# Patient Record
Sex: Male | Born: 1965 | Race: White | Hispanic: No | Marital: Married | Smoking: Current every day smoker
Health system: Southern US, Community
[De-identification: ages and names within clinical notes are randomized; demographics above are authoritative.]

---

## 2016-11-09 ENCOUNTER — Emergency Department: Payer: Medicaid Other

## 2016-11-09 ENCOUNTER — Encounter: Payer: Self-pay | Admitting: *Deleted

## 2016-11-09 ENCOUNTER — Emergency Department
Admission: EM | Admit: 2016-11-09 | Discharge: 2016-11-09 | Disposition: A | Discharge status: Home / Self Care | Payer: Medicaid Other | Attending: Emergency Medicine | Admitting: Emergency Medicine

## 2016-11-09 DIAGNOSIS — M79675 Pain in left toe(s): Secondary | ICD-10-CM

## 2016-11-09 DIAGNOSIS — R030 Elevated blood-pressure reading, without diagnosis of hypertension: Secondary | ICD-10-CM | POA: Diagnosis not present

## 2016-11-09 MED ORDER — NAPROXEN 500 MG PO TABS: 500.0000 mg | ORAL_TABLET | Freq: Two times a day (BID) | ORAL | 0 refills | Status: AC

## 2016-11-09 NOTE — Discharge Instructions (Signed)
Follow-up with the name of the physician listed on your Medicaid card. Your blood pressure needs to be rechecked. Discontinue taking ibuprofen and begin taking naproxen 500 mg twice a day with food. Wear postop shoe for added support and to decrease bending over today.

## 2016-11-09 NOTE — ED Notes (Signed)
See triage note.  States he stubbed his left great toe several months ago  conts to have pain   States pain is at base of toe and moving into lateral foot   No deformity noted

## 2016-11-09 NOTE — ED Provider Notes (Signed)
[facility] Emergency Department Provider Note ____________________________________________  Time seen: 10:11 AM  I have reviewed the triage vital signs and the nursing notes.  HISTORY  Chief Complaint  Toe Pain   HPI Bradley Michael is a 51 y.o. male is here with complaint of left great toe hurting. Patient states that he injured his toe 6 months ago and was not seen at that time for his injury. He states his continued to hurt but today especially with ambulation. It is not taking any over-the-counter medication for his pain. He denies any history of gout. Currently rates his pain as a 5/10. He has occasionally taken ibuprofen with minimal relief.  History reviewed. No pertinent past medical history.  There are no active problems to display for this patient.   No past surgical history on file.  Prior to Admission medications   Medication Sig Start Date End Date Taking? Authorizing Provider  naproxen (NAPROSYN) 500 MG tablet Take 1 tablet (500 mg total) by mouth 2 (two) times daily with a meal. 11/09/16   Tommi Rumps, PA-C    Allergies Penicillins  History reviewed. No pertinent family history.  Social History Social History  Substance Use Topics  . Smoking status: Not on file  . Smokeless tobacco: Not on file  . Alcohol use Not on file    Review of Systems  Constitutional: Negative for fever. Cardiovascular: Negative for chest pain. Respiratory: Negative for shortness of breath. Musculoskeletal:Positive left great toe pain chronic. Skin: Negative for rash. Neurological: Negative for  focal weakness or numbness. ____________________________________________  PHYSICAL EXAM:  VITAL SIGNS: ED Triage Vitals  Enc Vitals Group     BP 11/09/16 0925 (!) 162/108     Pulse Rate 11/09/16 0925 (!) 110     Resp 11/09/16 0925 18     Temp 11/09/16 0925 97.7 F (36.5 C)     Temp Source 11/09/16 0925 Oral     SpO2 11/09/16 0925 98 %     Weight  11/09/16 0921 197 lb (89.4 kg)     Height 11/09/16 0921 5\' 8"  (1.727 m)     Head Circumference --      Peak Flow --      Pain Score 11/09/16 0921 5     Pain Loc --      Pain Edu? --      Excl. in GC? --     Constitutional: Alert and oriented. Well appearing and in no distress. Head: Normocephalic and atraumatic. Cardiovascular: Normal rate, regular rhythm. Normal distal pulses. Respiratory: Normal respiratory effort. No wheezes/rales/rhonchi. Musculoskeletal: Examination of left great toe there is no deformity. There is no evidence of an ingrown nail. There is no erythema, ecchymosis or abrasions seen. Skin is intact. Range of motion is slightly uncomfortable but patient is able to flex and extend. No soft tissue swelling present. Capillary refill and motor sensory function intact. Neurologic:  Normal gait without ataxia. Normal speech and language. No gross focal neurologic deficits are appreciated. Skin:  Skin is warm, dry and intact. No rash noted. Psychiatric: Mood and affect are normal. Patient exhibits appropriate insight and judgment. ____________________________________________   RADIOLOGY Left great toe x-ray per radiologist is negative for fracture and soft tissue is unremarkable. I, Tommi Rumps, personally viewed and evaluated these images (plain radiographs) as part of my medical decision making, as well as reviewing the written report by the radiologist. ____________________________________________  INITIAL IMPRESSION / ASSESSMENT AND PLAN / ED COURSE  Patient is discontinue  taking ibuprofen and begin taking naproxen 500 mg twice a day with food. He is also placed in a wooden shoe and to follow-up with Dr. Ether GriffinsFowler who is the podiatrist on-call. He is also encouraged to have his blood pressure rechecked as it was elevated while in the department. Patient states that he is extremely nervous around "hospitals".    ____________________________________________  FINAL  CLINICAL IMPRESSION(S) / ED DIAGNOSES  Final diagnoses:  Great toe pain, left  Elevated blood pressure reading     Eather ColasSummers, Jacalyn Biggs L, PA-C 11/09/16 1237    Loleta RoseForbach, Cory, MD 11/09/16 1324

## 2016-11-09 NOTE — ED Triage Notes (Signed)
Pt states he stubbed his left big toe 6 months ago while moving and it still hurts, ambulatory

## 2017-10-18 ENCOUNTER — Emergency Department: Payer: Medicaid Other

## 2017-10-18 ENCOUNTER — Emergency Department
Admission: EM | Admit: 2017-10-18 | Discharge: 2017-10-18 | Disposition: A | Discharge status: Home / Self Care | Payer: Medicaid Other | Attending: Student in an Organized Health Care Education/Training Program | Admitting: Student in an Organized Health Care Education/Training Program

## 2017-10-18 ENCOUNTER — Other Ambulatory Visit: Payer: Self-pay

## 2017-10-18 ENCOUNTER — Encounter: Payer: Self-pay | Admitting: Emergency Medicine

## 2017-10-18 DIAGNOSIS — F17200 Nicotine dependence, unspecified, uncomplicated: Secondary | ICD-10-CM | POA: Diagnosis not present

## 2017-10-18 DIAGNOSIS — R05 Cough: Secondary | ICD-10-CM | POA: Diagnosis present

## 2017-10-18 DIAGNOSIS — R059 Cough, unspecified: Secondary | ICD-10-CM

## 2017-10-18 DIAGNOSIS — Z79899 Other long term (current) drug therapy: Secondary | ICD-10-CM | POA: Insufficient documentation

## 2017-10-18 MED ORDER — PREDNISONE 50 MG PO TABS: ORAL_TABLET | ORAL | 0 refills | Status: AC

## 2017-10-18 MED ORDER — CEFTRIAXONE SODIUM 1 G IJ SOLR
1.0000 g | Freq: Once | INTRAMUSCULAR | Status: AC
  Administered 2017-10-18: 1 g via INTRAMUSCULAR
  Filled 2017-10-18: qty 10

## 2017-10-18 MED ORDER — BENZONATATE 100 MG PO CAPS: 100.0000 mg | ORAL_CAPSULE | Freq: Three times a day (TID) | ORAL | 0 refills | Status: AC | PRN

## 2017-10-18 MED ORDER — AZITHROMYCIN 250 MG PO TABS: ORAL_TABLET | ORAL | 0 refills | Status: AC

## 2017-10-18 MED ORDER — AZITHROMYCIN 500 MG PO TABS
500.0000 mg | ORAL_TABLET | Freq: Once | ORAL | Status: AC
  Administered 2017-10-18: 500 mg via ORAL
  Filled 2017-10-18: qty 1

## 2017-10-18 MED ORDER — PREDNISONE 20 MG PO TABS
60.0000 mg | ORAL_TABLET | Freq: Once | ORAL | Status: AC
  Administered 2017-10-18: 60 mg via ORAL
  Filled 2017-10-18: qty 3

## 2017-10-18 NOTE — ED Notes (Signed)
Pt discharged to home.  Family member driving.  Discharge instructions reviewed.  Verbalized understanding.  No questions or concerns at this time.  Teach back verified.  Pt in NAD.  No items left in ED.   

## 2017-10-18 NOTE — ED Notes (Signed)
Pt ambulatory from triage, pt in NAD.  Pt having dry hacking cough x1 week with no relief with OTC medication.

## 2017-10-18 NOTE — ED Triage Notes (Signed)
Pt to ED via POV c/o cough x 1 week. Pt states that he is unable to get anything up when he cough. Pt states that he has been taking OTC medication without relief. Pt in NAD at this time.

## 2017-10-18 NOTE — ED Provider Notes (Signed)
[facility] Emergency Department Provider Note  ____________________________________________  Time seen: Approximately 8:54 PM  I have reviewed the triage vital signs and the nursing notes.   HISTORY  Chief Complaint Cough    HPI Bradley Michael is a 52 y.o. male presents to the emergency department with nonproductive cough, shortness of breath, fatigue and chills for approximately 7 days.  Patient reports that symptoms seem to be worsening.  Patient is a daily smoker.  He denies chest pain and chest tightness.  His appetite has diminished.  Patient has had community-acquired pneumonia approximately 20 years ago.  No alleviating measures have been attempted.   History reviewed. No pertinent past medical history.  There are no active problems to display for this patient.   History reviewed. No pertinent surgical history.  Prior to Admission medications   Medication Sig Start Date End Date Taking? Authorizing Provider  azithromycin (ZITHROMAX Z-PAK) 250 MG tablet Take 2 tablets (500 mg) on  Day 1,  followed by 1 tablet (250 mg) once daily on Days 2 through 5. 10/18/17 10/23/17  Orvil Feil, PA-C  benzonatate (TESSALON PERLES) 100 MG capsule Take 1 capsule (100 mg total) by mouth 3 (three) times daily as needed for up to 7 days for cough. 10/18/17 10/25/17  Orvil Feil, PA-C  naproxen (NAPROSYN) 500 MG tablet Take 1 tablet (500 mg total) by mouth 2 (two) times daily with a meal. 11/09/16   Tommi Rumps, PA-C  predniSONE (DELTASONE) 50 MG tablet Take one 50 mg tablet once daily for the next four days. 10/18/17   Orvil Feil, PA-C    Allergies Penicillins  No family history on file.  Social History Social History   Tobacco Use  . Smoking status: Current Every Day Smoker  . Smokeless tobacco: Never Used  Substance Use Topics  . Alcohol use: Not Currently  . Drug use: Not Currently     Review of Systems  Constitutional: Patient has chills.   Eyes: No visual changes. No discharge ENT: No upper respiratory complaints. Cardiovascular: no chest pain. Respiratory: Patient has shortness of breath and cough. Gastrointestinal: No abdominal pain.  No nausea, no vomiting.  No diarrhea.  No constipation. Musculoskeletal: Negative for musculoskeletal pain. Skin: Negative for rash, abrasions, lacerations, ecchymosis. Neurological: Negative for headaches, focal weakness or numbness.   ____________________________________________   PHYSICAL EXAM:  VITAL SIGNS: ED Triage Vitals  Enc Vitals Group     BP 10/18/17 1935 (!) 160/88     Pulse Rate 10/18/17 1935 (!) 105     Resp 10/18/17 1935 18     Temp 10/18/17 1935 98.5 F (36.9 C)     Temp Source 10/18/17 1935 Oral     SpO2 10/18/17 1935 94 %     Weight 10/18/17 1935 193 lb (87.5 kg)     Height 10/18/17 1935  (1.727 m)     Head Circumference --      Peak Flow --      Pain Score 10/18/17 1937 0     Pain Loc --      Pain Edu? --      Excl. in GC? --      Constitutional: Alert and oriented. Well appearing and in no acute distress. Eyes: Conjunctivae are normal. PERRL. EOMI. Head: Atraumatic. ENT:      Ears: TMs are pearly.      Nose: No congestion/rhinnorhea.      Mouth/Throat: Mucous membranes are moist.  Hematological/Lymphatic/Immunilogical: No cervical  lymphadenopathy. Cardiovascular: Normal rate, regular rhythm. Normal S1 and S2.  Good peripheral circulation. Respiratory: Normal respiratory effort without tachypnea or retractions. Lungs CTAB. Good air entry to the bases with no decreased or absent breath sounds. Skin:  Skin is warm, dry and intact. No rash noted.  ____________________________________________   LABS (all labs ordered are listed, but only abnormal results are displayed)  Labs Reviewed - No data to display ____________________________________________  EKG   ____________________________________________  RADIOLOGY Geraldo Pitter,  personally viewed and evaluated these images (plain radiographs) as part of my medical decision making, as well as reviewing the written report by the radiologist.  Dg Chest 2 View  Result Date: 10/18/2017 CLINICAL DATA:  Cough for 1 week. EXAM: CHEST - 2 VIEW COMPARISON:  None. FINDINGS: Subtle mild opacity in the right upper lobe. The heart, hila, mediastinum, lungs, and pleura are otherwise normal. IMPRESSION: Probable subtle pneumonia in the right upper lobe. Recommend follow-up to resolution. Electronically Signed   By: Gerome Sam III M.D   On: 10/18/2017 19:56    ____________________________________________    PROCEDURES  Procedure(s) performed:    Procedures    Medications  cefTRIAXone (ROCEPHIN) injection 1 g (1 g Intramuscular Given 10/18/17 2127)  azithromycin (ZITHROMAX) tablet 500 mg (500 mg Oral Given 10/18/17 2127)  predniSONE (DELTASONE) tablet 60 mg (60 mg Oral Given 10/18/17 2127)     ____________________________________________   INITIAL IMPRESSION / ASSESSMENT AND PLAN / ED COURSE  Pertinent labs & imaging results that were available during my care of the patient were reviewed by me and considered in my medical decision making (see chart for details).  Review of the [place] CSRS was performed in accordance of the [facility] prior to dispensing any controlled drugs.    Assessment and plan Community-acquired pneumonia Patient presents to the emergency department with nonproductive cough, fever, chills and malaise for approximately 1 week.  Chest x-ray is concerning for right upper lobe consolidation.  Patient was given Rocephin, azithromycin and prednisone in the emergency department.  He was discharged with azithromycin, prednisone and Tessalon Perles.  He was advised to follow-up with primary care as needed.  All patient questions were answered.    ____________________________________________  FINAL CLINICAL IMPRESSION(S) / ED DIAGNOSES  Final diagnoses:   Cough      NEW MEDICATIONS STARTED DURING THIS VISIT:  ED Discharge Orders        Ordered    azithromycin (ZITHROMAX Z-PAK) 250 MG tablet     10/18/17 2140    predniSONE (DELTASONE) 50 MG tablet     10/18/17 2140    benzonatate (TESSALON PERLES) 100 MG capsule  3 times daily PRN     10/18/17 2143          This chart was dictated using voice recognition software/Dragon. Despite best efforts to proofread, errors can occur which can change the meaning. Any change was purely unintentional.    Gasper Lloyd 10/18/17 2146    Willy Eddy, MD 10/18/17 2255

## 2018-07-24 IMAGING — DX DG TOE GREAT 2+V*L*
3 series · 3 of 3 positions shown · non-contrast
Comparison: None.

CLINICAL DATA: Injury 6 months ago.  Pain.

EXAM:
LEFT GREAT TOE

[toe ap]
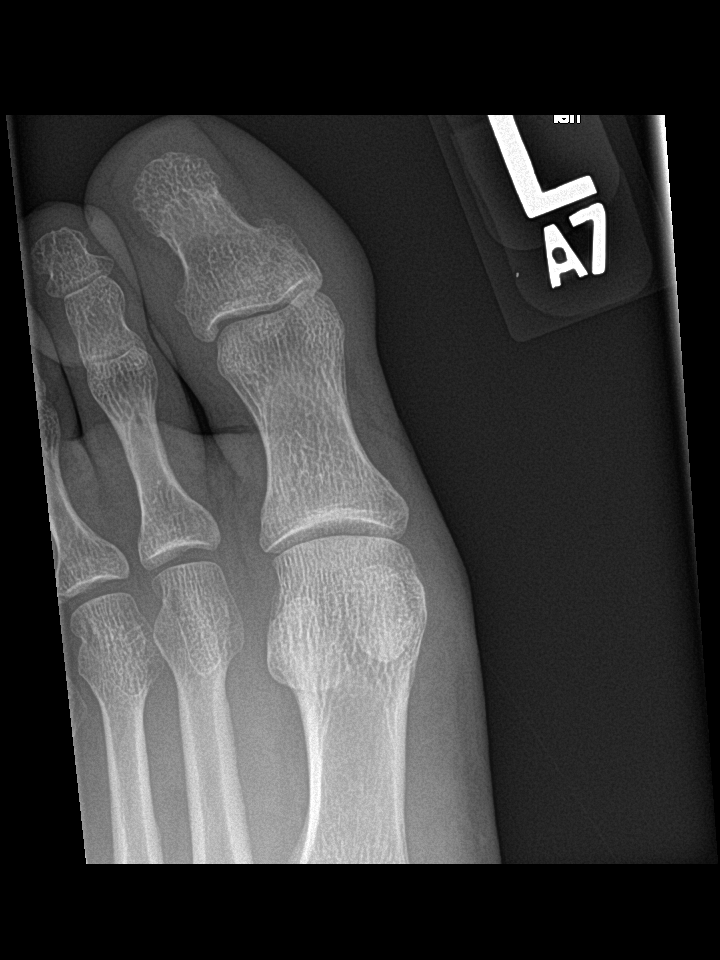

[toe obl]
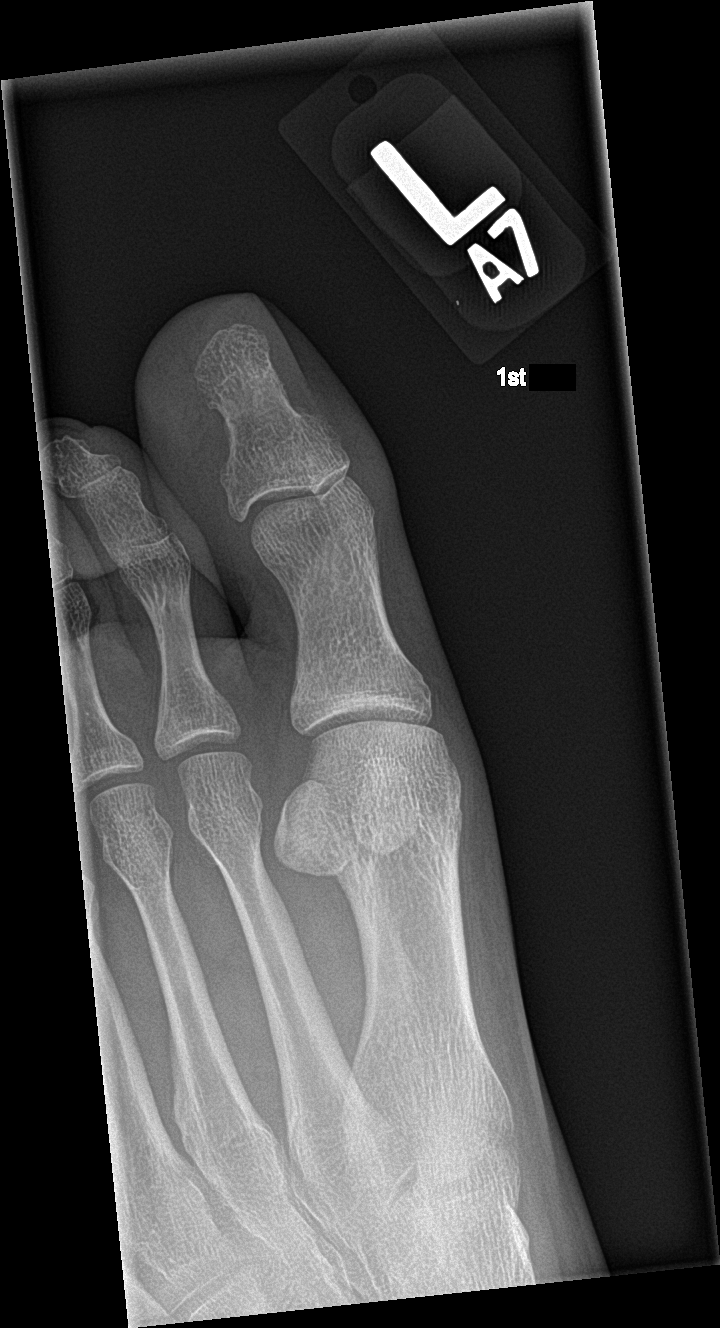

[toe lat]
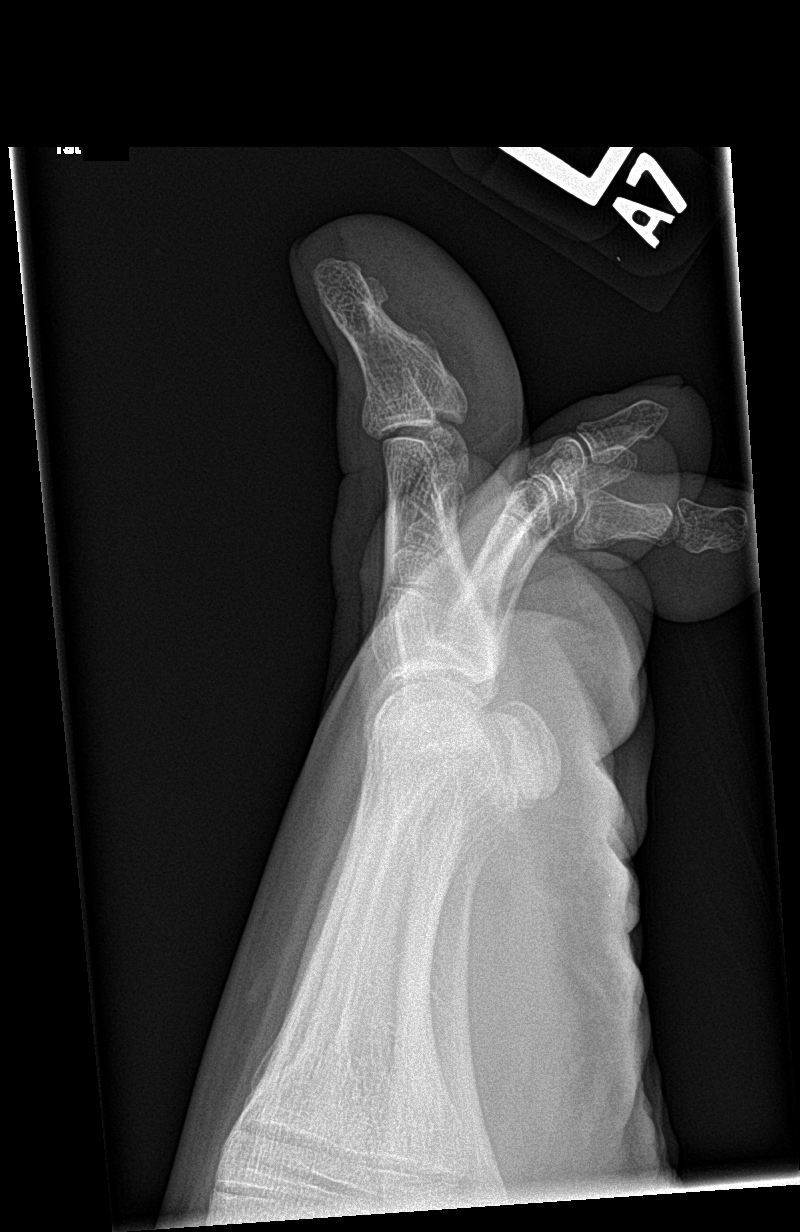

[3 of 3 positions shown; findings below may reference images not displayed]

FINDINGS: No acute fracture. No dislocation.  Unremarkable soft tissues.
IMPRESSION: No acute bony pathology.

## 2019-07-02 IMAGING — CR DG CHEST 2V
1 series · 2 of 2 positions shown · non-contrast
Comparison: None.

CLINICAL DATA: Cough for 1 week.

EXAM:
CHEST - 2 VIEW

[Series 1: dg chest 2 view · 0.14mm/px · 2 of 2 slices shown]
[im 1/2]
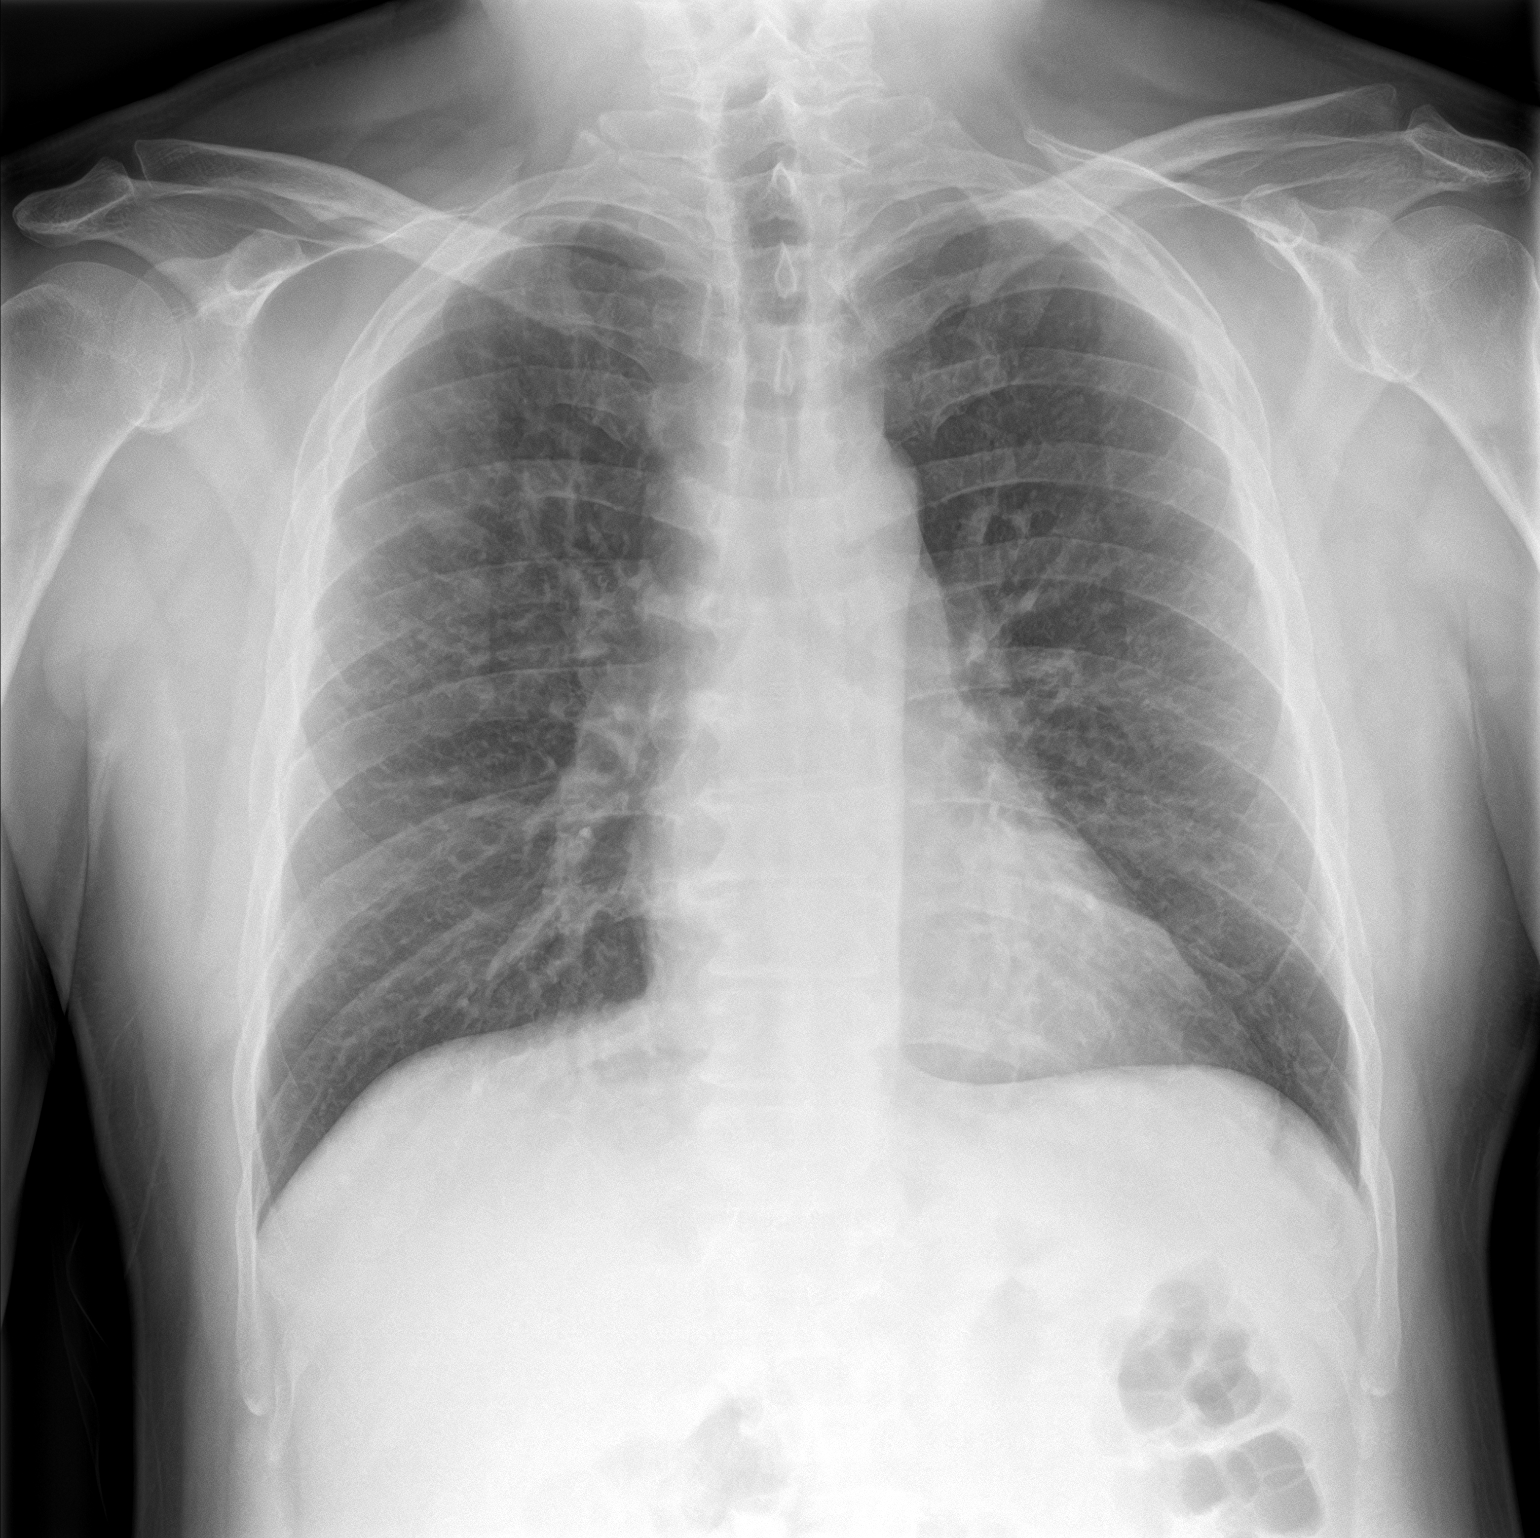
[im 2/2]
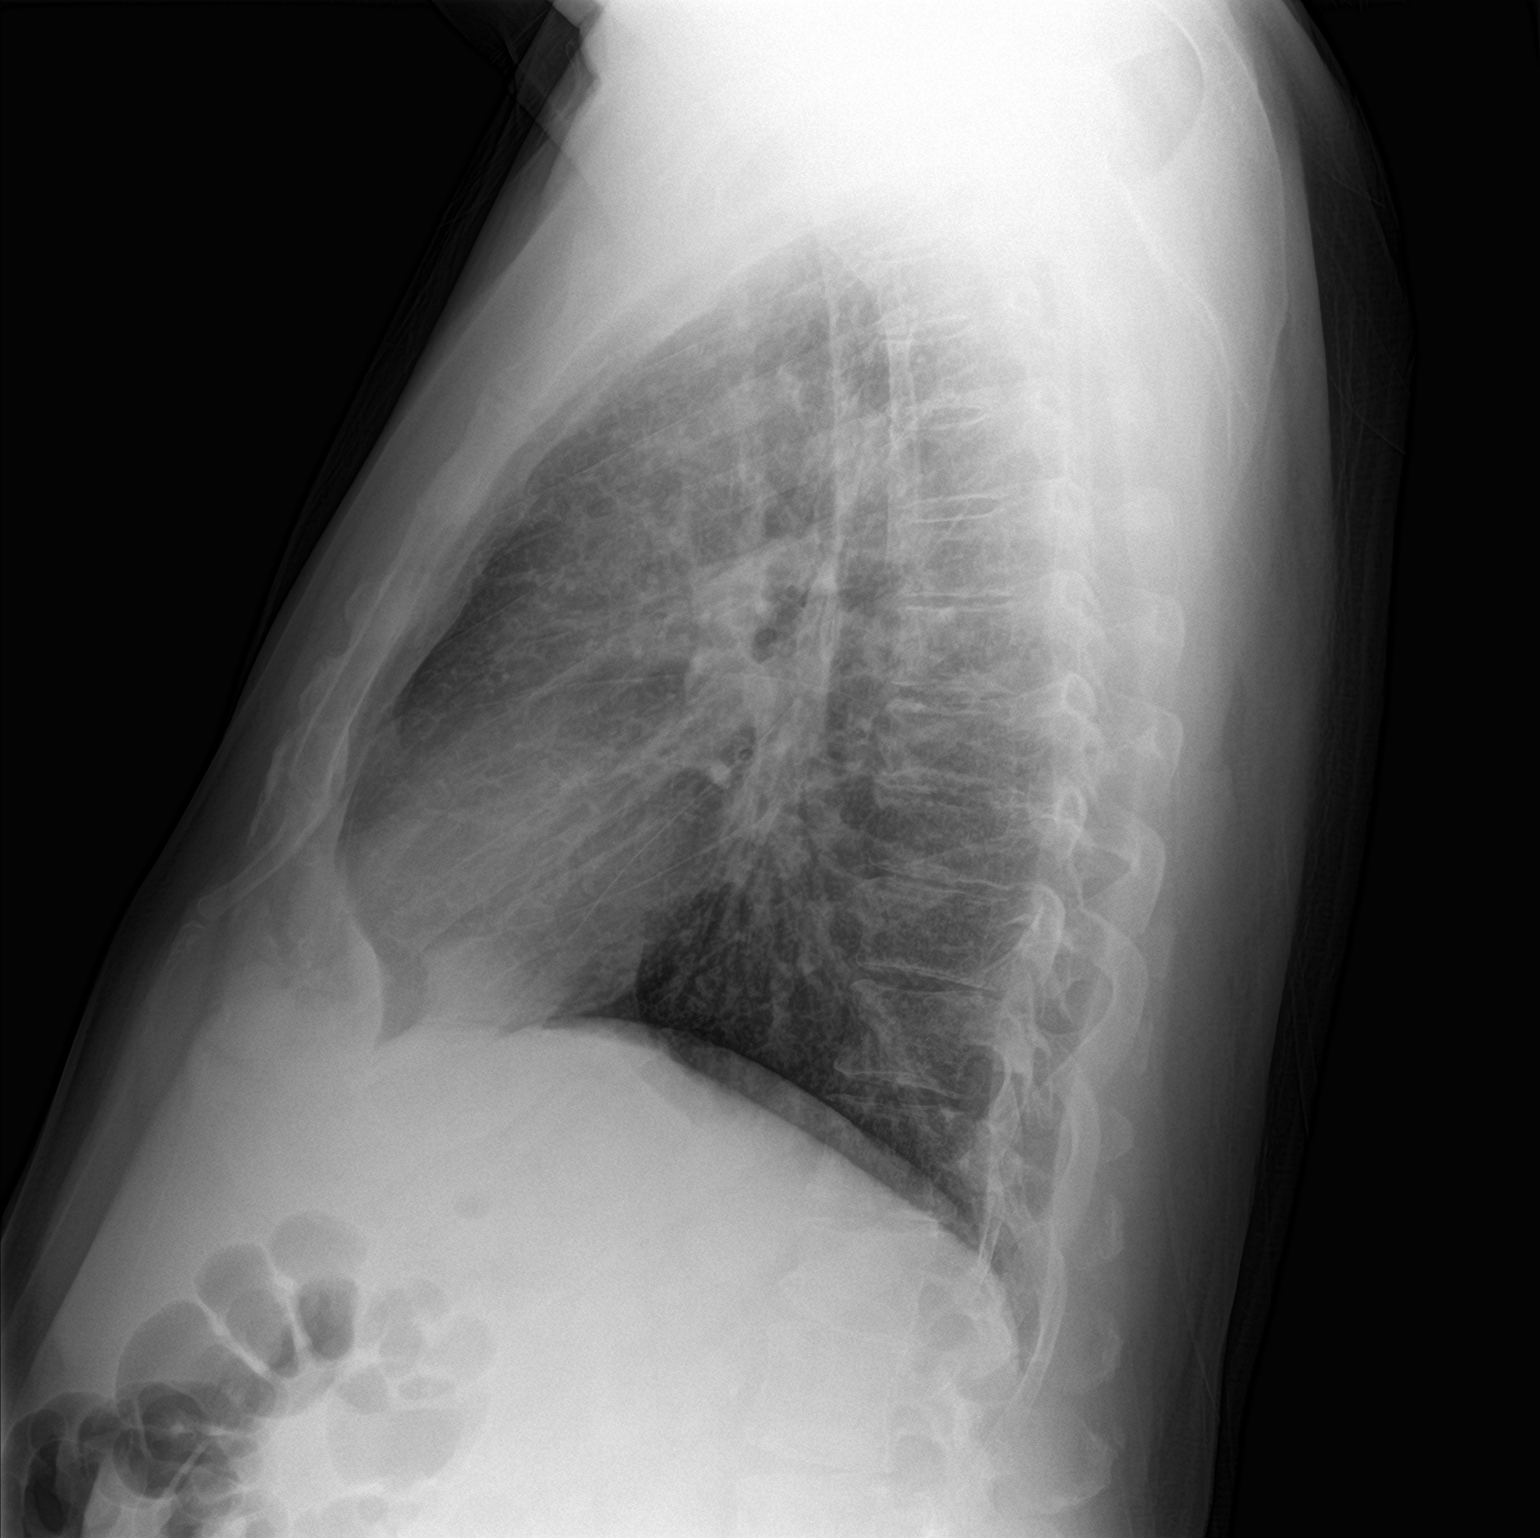

[2 of 2 positions shown; findings below may reference images not displayed]

FINDINGS: Subtle mild opacity in the right upper lobe. The heart, hila,
mediastinum, lungs, and pleura are otherwise normal.
IMPRESSION: Probable subtle pneumonia in the right upper lobe. Recommend
follow-up to resolution.
